# Patient Record
Sex: Female | Born: 1975 | Race: White | Hispanic: No | Marital: Married | Smoking: Never smoker
Health system: Southern US, Community
[De-identification: ages and names within clinical notes are randomized; demographics above are authoritative.]

## PROBLEM LIST (undated history)

## (undated) DIAGNOSIS — E282 Polycystic ovarian syndrome: Secondary | ICD-10-CM

---

## 2020-03-16 ENCOUNTER — Emergency Department: Payer: Self-pay

## 2020-03-16 ENCOUNTER — Other Ambulatory Visit: Payer: Self-pay

## 2020-03-16 ENCOUNTER — Emergency Department
Admission: EM | Admit: 2020-03-16 | Discharge: 2020-03-16 | Disposition: A | Discharge status: Home / Self Care | Payer: Self-pay | Attending: Emergency Medicine | Admitting: Emergency Medicine

## 2020-03-16 DIAGNOSIS — R2990 Unspecified symptoms and signs involving the nervous system: Secondary | ICD-10-CM

## 2020-03-16 DIAGNOSIS — I639 Cerebral infarction, unspecified: Secondary | ICD-10-CM | POA: Insufficient documentation

## 2020-03-16 DIAGNOSIS — H538 Other visual disturbances: Secondary | ICD-10-CM

## 2020-03-16 HISTORY — DX: Polycystic ovarian syndrome: E28.2

## 2020-03-16 LAB — PROTIME-INR
INR: 1 (ref 0.8–1.2)
Prothrombin Time: 12.4 seconds (ref 11.4–15.2)

## 2020-03-16 LAB — CBC
HCT: 41.8 % (ref 36.0–46.0)
Hemoglobin: 14.2 g/dL (ref 12.0–15.0)
MCH: 27.5 pg (ref 26.0–34.0)
MCHC: 34 g/dL (ref 30.0–36.0)
MCV: 80.9 fL (ref 80.0–100.0)
Platelets: 263 10*3/uL (ref 150–400)
RBC: 5.17 MIL/uL — ABNORMAL HIGH (ref 3.87–5.11)
RDW: 13 % (ref 11.5–15.5)
WBC: 9.9 10*3/uL (ref 4.0–10.5)
nRBC: 0 % (ref 0.0–0.2)

## 2020-03-16 LAB — DIFFERENTIAL
Abs Immature Granulocytes: 0.03 10*3/uL (ref 0.00–0.07)
Basophils Absolute: 0 10*3/uL (ref 0.0–0.1)
Basophils Relative: 0 %
Eosinophils Absolute: 0.1 10*3/uL (ref 0.0–0.5)
Eosinophils Relative: 1 %
Immature Granulocytes: 0 %
Lymphocytes Relative: 22 %
Lymphs Abs: 2.2 10*3/uL (ref 0.7–4.0)
Monocytes Absolute: 0.6 10*3/uL (ref 0.1–1.0)
Monocytes Relative: 6 %
Neutro Abs: 7 10*3/uL (ref 1.7–7.7)
Neutrophils Relative %: 71 %

## 2020-03-16 LAB — URINALYSIS, COMPLETE (UACMP) WITH MICROSCOPIC
Bilirubin Urine: NEGATIVE
Glucose, UA: 50 mg/dL — AB
Hgb urine dipstick: NEGATIVE
Ketones, ur: NEGATIVE mg/dL
Nitrite: NEGATIVE
Protein, ur: NEGATIVE mg/dL
Specific Gravity, Urine: 1.011 (ref 1.005–1.030)
pH: 7 (ref 5.0–8.0)

## 2020-03-16 LAB — POC URINE PREG, ED: Preg Test, Ur: NEGATIVE

## 2020-03-16 LAB — COMPREHENSIVE METABOLIC PANEL
ALT: 16 U/L (ref 0–44)
AST: 17 U/L (ref 15–41)
Albumin: 4.2 g/dL (ref 3.5–5.0)
Alkaline Phosphatase: 59 U/L (ref 38–126)
Anion gap: 9 (ref 5–15)
BUN: 15 mg/dL (ref 6–20)
CO2: 27 mmol/L (ref 22–32)
Calcium: 9.3 mg/dL (ref 8.9–10.3)
Chloride: 101 mmol/L (ref 98–111)
Creatinine, Ser: 0.75 mg/dL (ref 0.44–1.00)
GFR, Estimated: >60 mL/min (ref >60)
Glucose, Bld: 111 mg/dL — ABNORMAL HIGH (ref 70–99)
Potassium: 4 mmol/L (ref 3.5–5.1)
Sodium: 137 mmol/L (ref 135–145)
Total Bilirubin: 0.5 mg/dL (ref 0.3–1.2)
Total Protein: 7.9 g/dL (ref 6.5–8.1)

## 2020-03-16 LAB — APTT: aPTT: 29 seconds (ref 24–36)

## 2020-03-16 LAB — CBG MONITORING, ED: Glucose-Capillary: 126 mg/dL — ABNORMAL HIGH (ref 70–99)

## 2020-03-16 LAB — TROPONIN I (HIGH SENSITIVITY): Troponin I (High Sensitivity): 3 ng/L (ref <18)

## 2020-03-16 IMAGING — CT CT HEAD CODE STROKE
3 series · 16 of 46 positions shown, 19 images · non-contrast
Comparison: None.

CLINICAL DATA: Code stroke.  Abnormal vision

EXAM:
CT HEAD WITHOUT CONTRAST
TECHNIQUE: Contiguous axial images were obtained from the base of the skull
through the vertex without intravenous contrast.

[Series 3: head wo · axial · 0.39mm/px · z∈[-148,-28]mm · 10 of 29 slices shown, 13 images]
[im 3/29  brain]
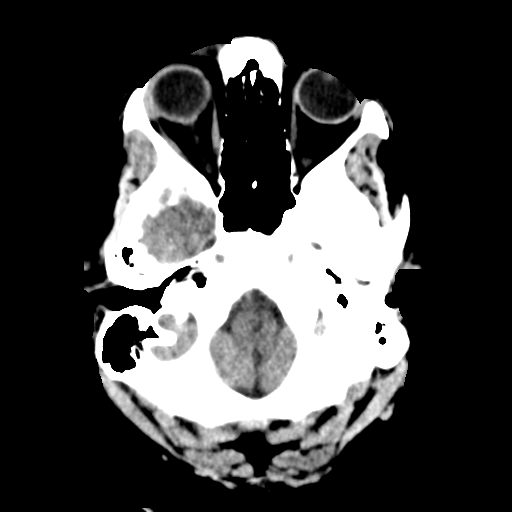
[im 3/29  bone]
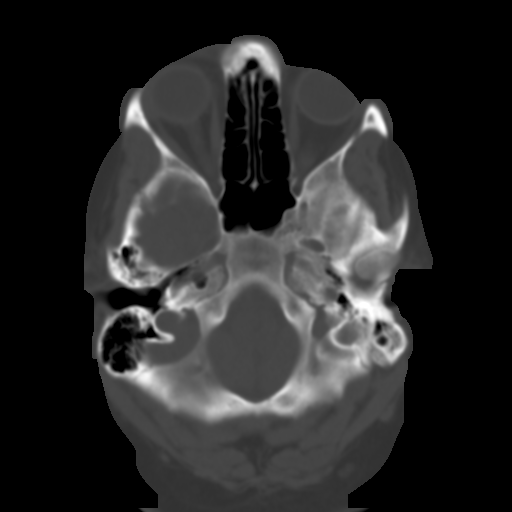
[im 6/29  brain]
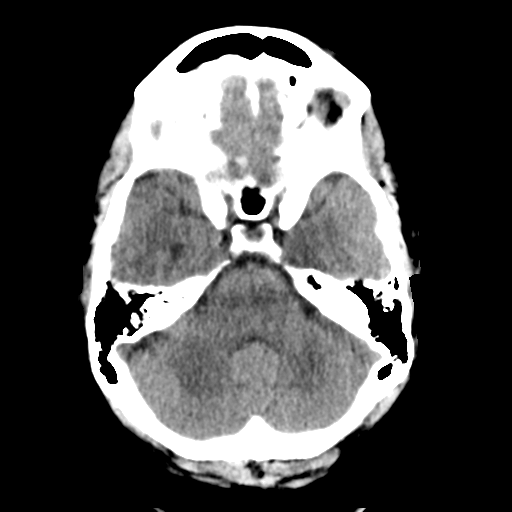
[im 8/29  brain]
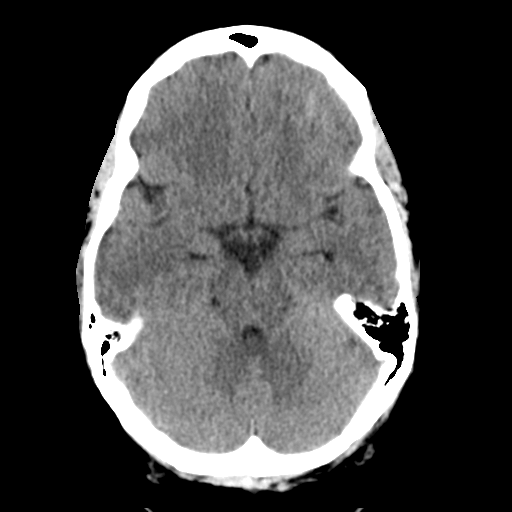
[im 11/29  brain]
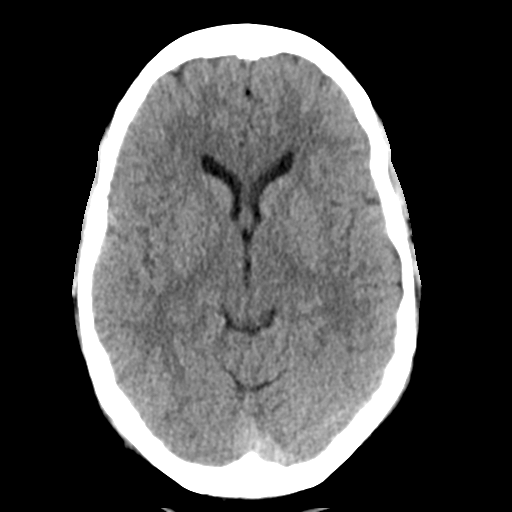
[im 14/29  brain]
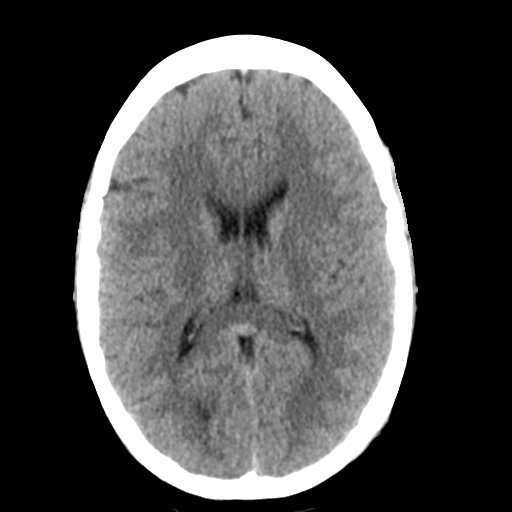
[im 14/29  bone]
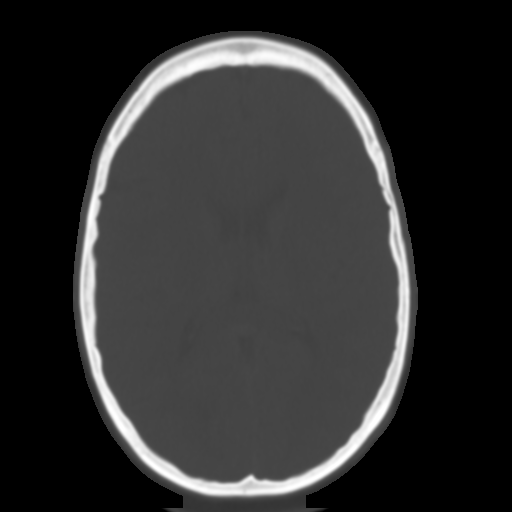
[im 16/29  brain]
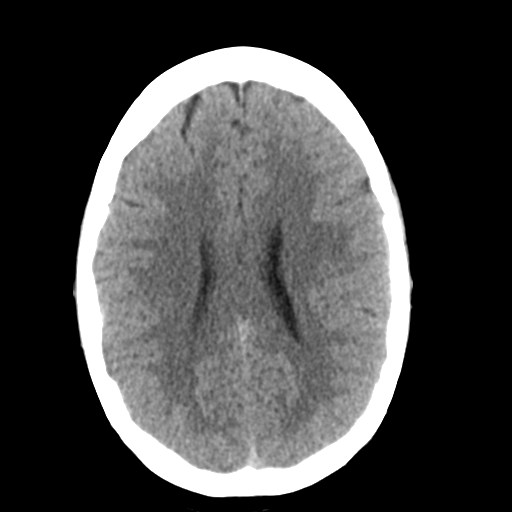
[im 19/29  brain]
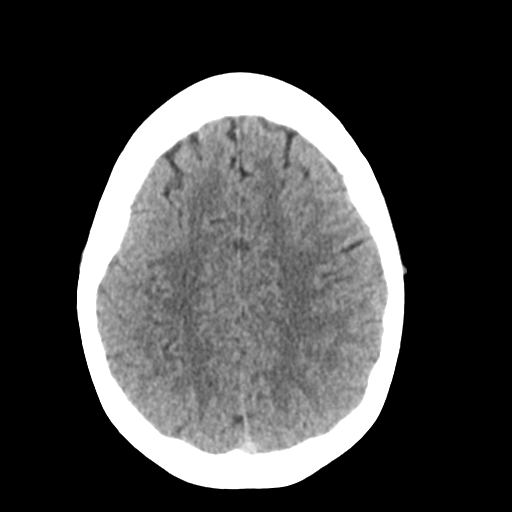
[im 22/29  brain]
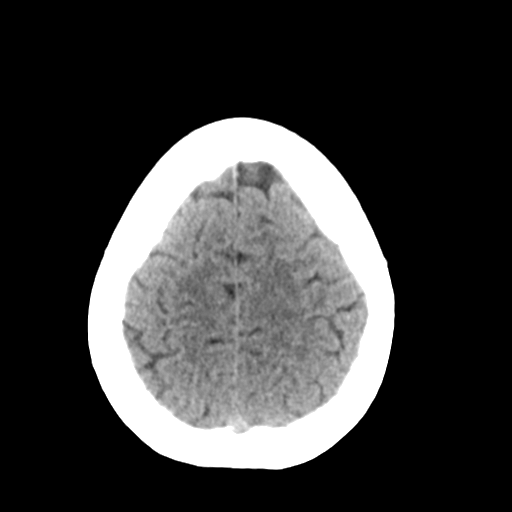
[im 24/29  brain]
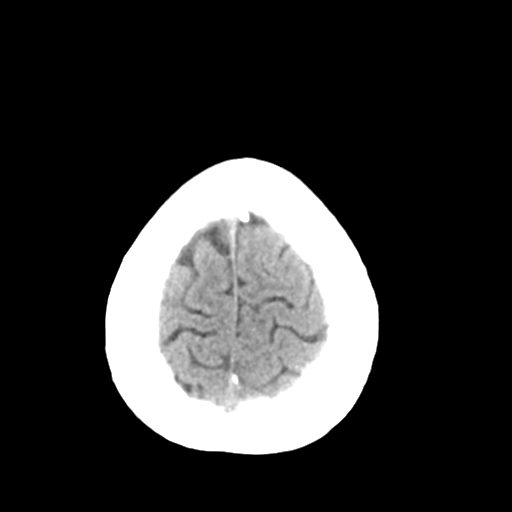
[im 24/29  bone]
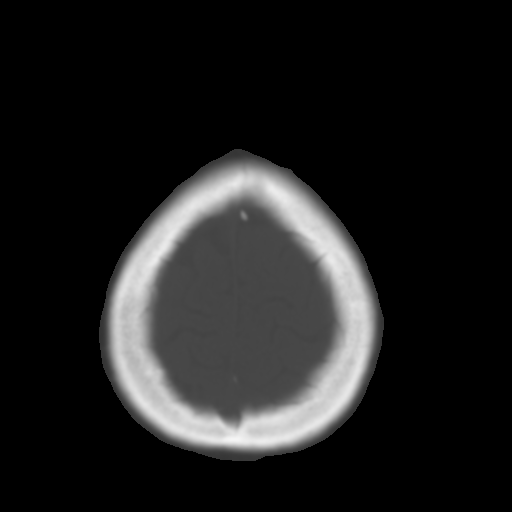
[im 27/29  brain]
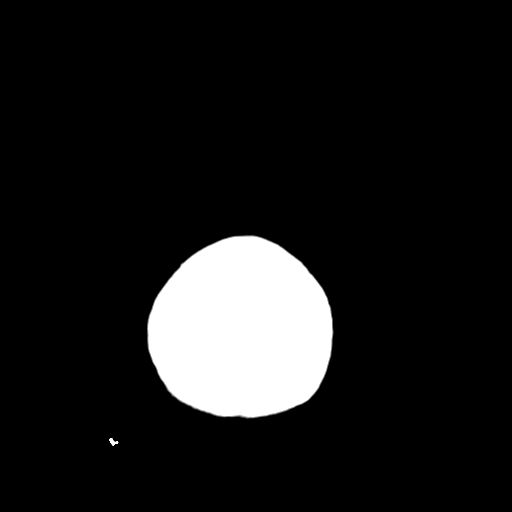

[Series 5: coronal soft tissue · coronal · 0.31mm/px · 3 of 67 slices shown]
[im 23/67  brain]
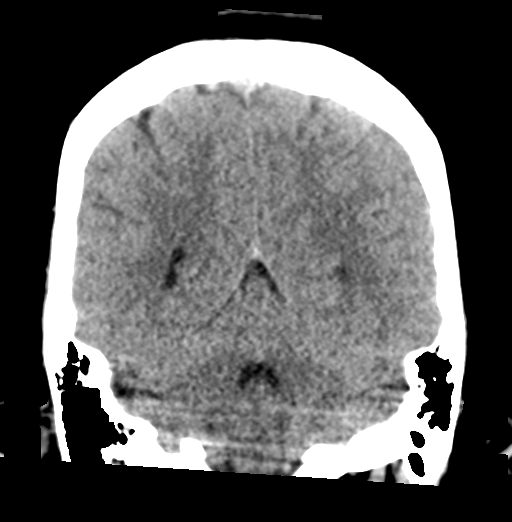
[im 30/67  brain]
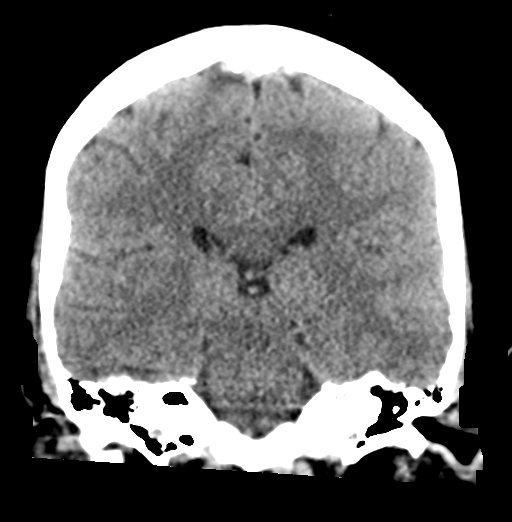
[im 37/67  brain]
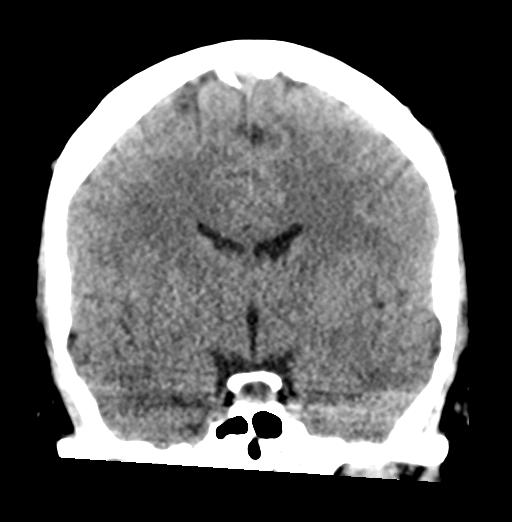

[Series 6: sagittal soft tissue · sagittal · 0.31mm/px · 3 of 59 slices shown]
[im 20/59  brain]
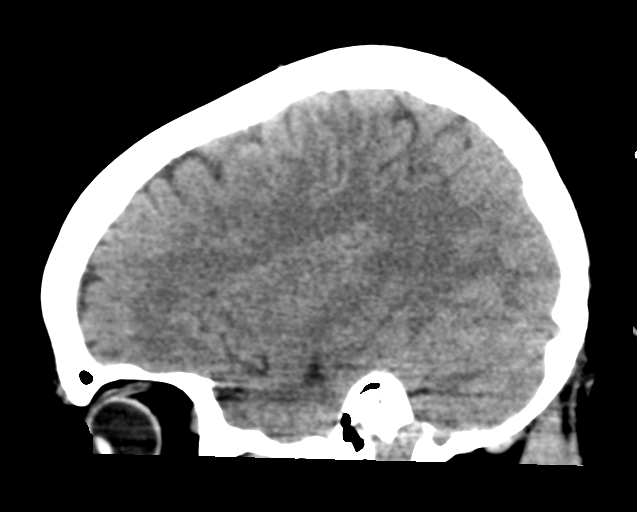
[im 30/59  brain]
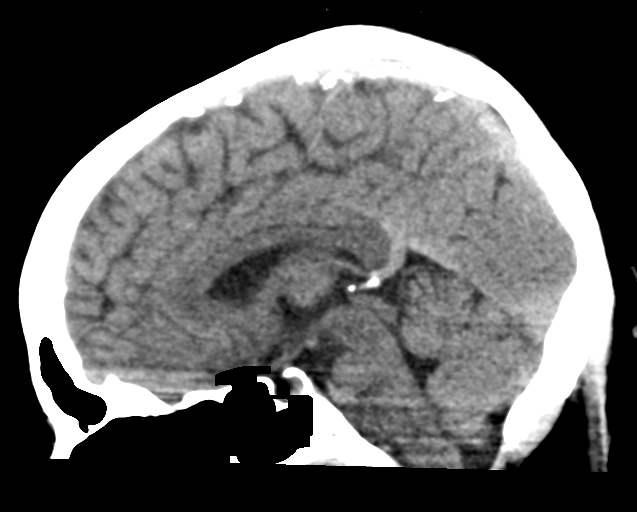
[im 39/59  brain]
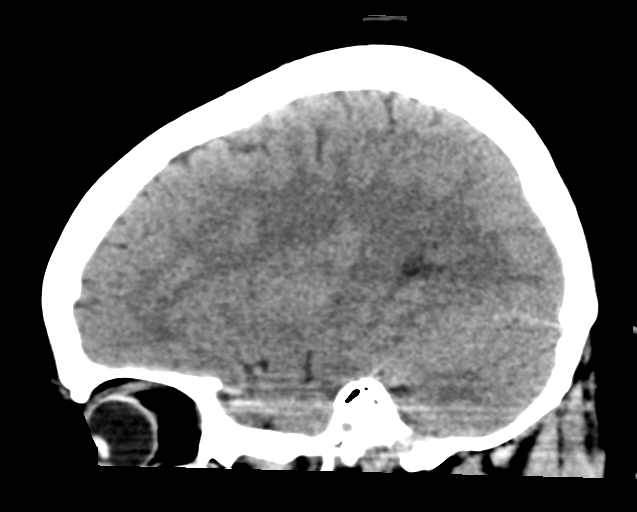

[16 of 46 positions shown; findings below may reference images not displayed]

FINDINGS: Brain: No acute intracranial hemorrhage, mass effect, or edema.
Gray-white differentiation is preserved. Ventricles and sulci are
within normal limits in size and configuration.

Vascular: No hyperdense vessel.

Skull: Unremarkable.

Sinuses/Orbits: Aerated.  Orbits are unremarkable.

Other: Mastoid air cells are clear.

ASPECTS (Alberta Stroke Program Early CT Score)

- Ganglionic level infarction (caudate, lentiform nuclei, internal
capsule, insula, M1-M3 cortex): 7

- Supraganglionic infarction (M4-M6 cortex): 3

Total score (0-10 with 10 being normal): 10
IMPRESSION: There is no acute intracranial hemorrhage or evidence of acute
infarction. ASPECT score is 10.

These results were communicated to Dr. KLPIGBB at [DATE] on
[DATE] by text page via the AMION messaging system.

## 2020-03-16 IMAGING — MR MR HEAD W/O CM
10 of 11 series · 38 of 48 positions shown · non-contrast
Comparison: [DATE] head CT.

CLINICAL DATA: Neuro deficit, acute, stroke suspected

EXAM:
MRI HEAD WITHOUT CONTRAST
TECHNIQUE: Multiplanar, multiecho pulse sequences of the brain and surrounding
structures were obtained without intravenous contrast.

[Series 2: cor dwi_tracew · coronal · 5.0mm · 0.60mm/px · 4 of 40 slices shown]
[im 1/40]
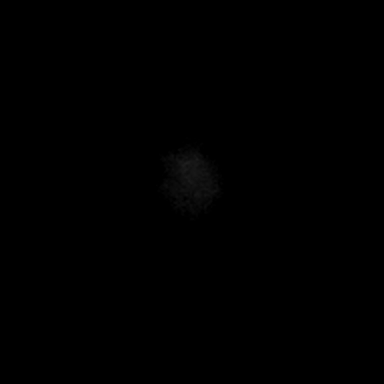
[im 14/40]
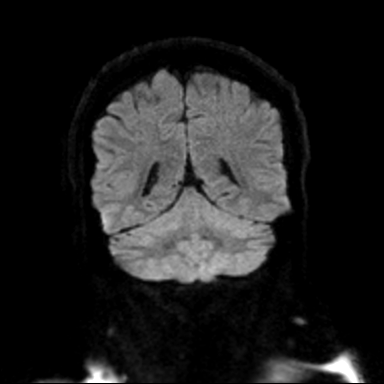
[im 27/40]
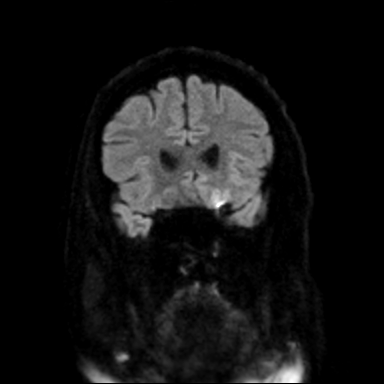
[im 40/40]
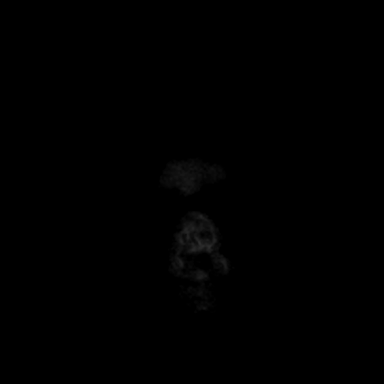

[Series 3: cor dwi_adc · coronal · 5.0mm · 0.60mm/px · 4 of 39 slices shown]
[im 1/39]
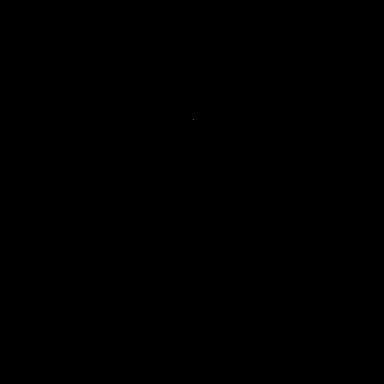
[im 13/39]
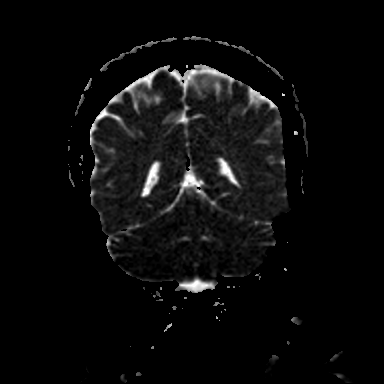
[im 26/39]
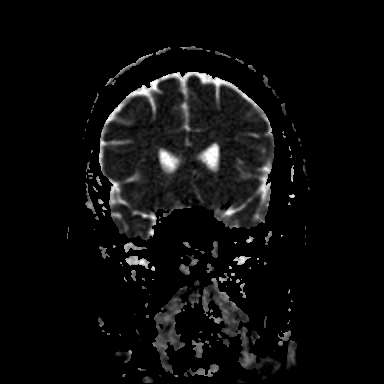
[im 39/39]
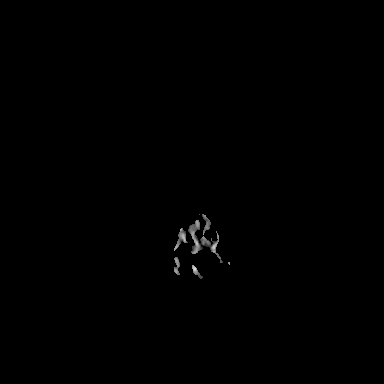

[Series 4: ax dwi_tracew · axial · 3.0mm · 0.60mm/px · z∈[-134,+19]mm · 6 of 48 slices shown]
[im 1/48]
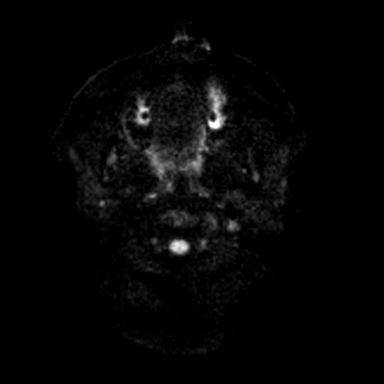
[im 10/48]
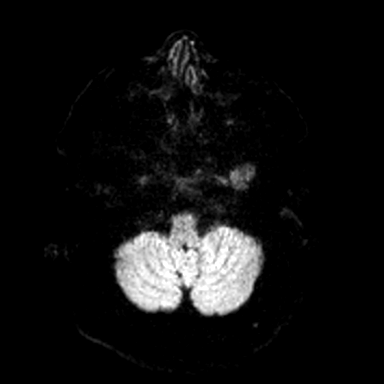
[im 19/48]
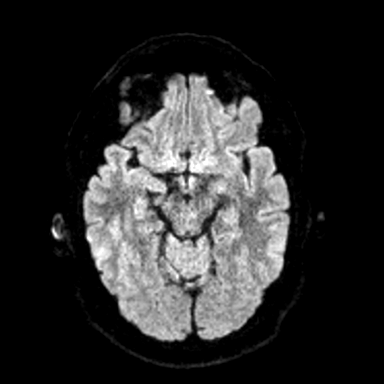
[im 29/48]
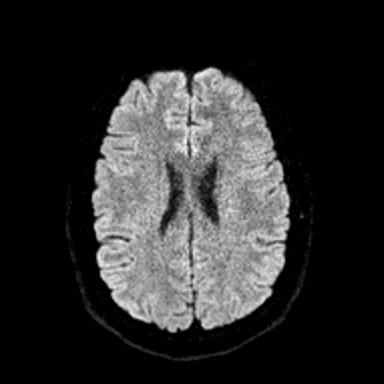
[im 38/48]
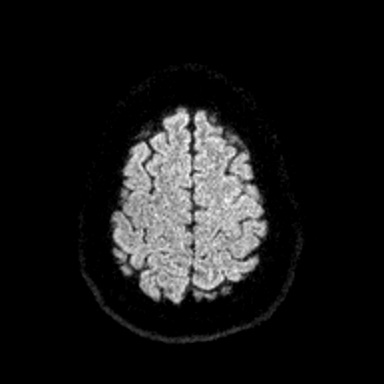
[im 48/48]
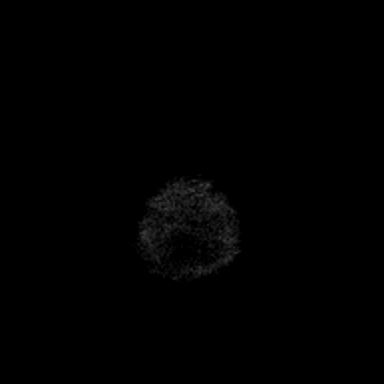

[Series 5: ax dwi_adc · axial · 3.0mm · 0.60mm/px · z∈[-134,+19]mm · 6 of 48 slices shown]
[im 1/48]
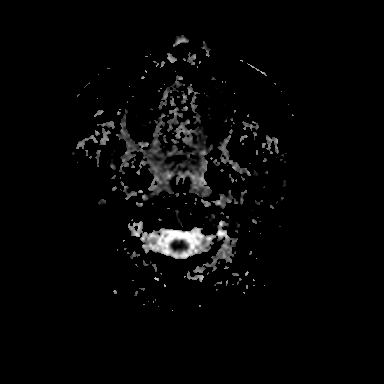
[im 10/48]
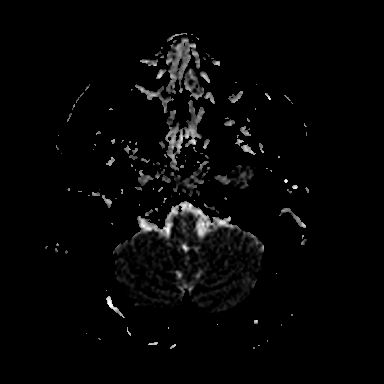
[im 19/48]
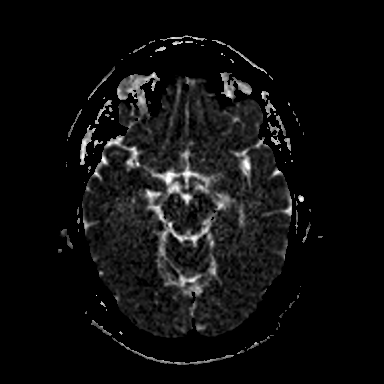
[im 29/48]
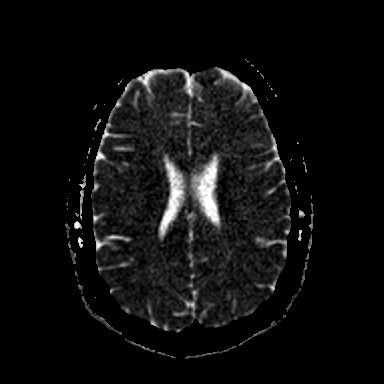
[im 38/48]
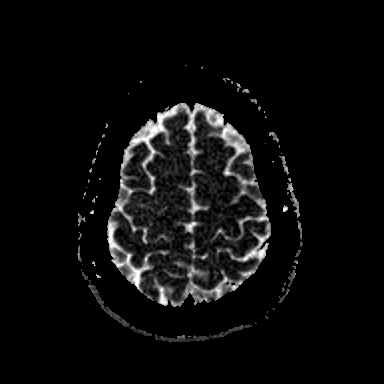
[im 48/48]
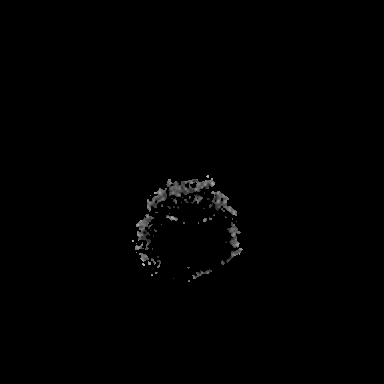

[Series 6: FLAIR · axial · 5.0mm · 1.20mm/px · z∈[-134,+20]mm · 3 of 27 slices shown]
[im 1/27]
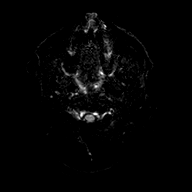
[im 14/27]
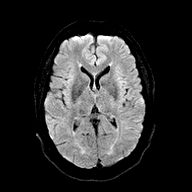
[im 27/27]
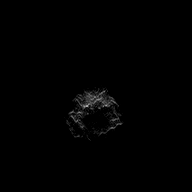

[Series 7: T1 · sagittal · 5.0mm · 0.94mm/px · 3 of 23 slices shown (1 of 2)]
[im 1/23]
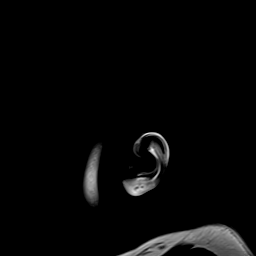
[im 12/23]
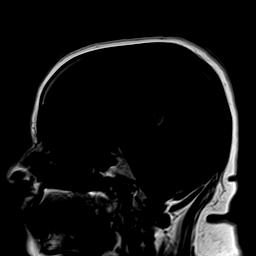
[im 23/23]
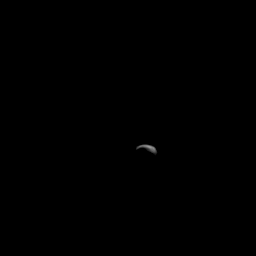

[Series 8: T2 · axial · 5.0mm · 0.45mm/px · z∈[-133,+21]mm · 3 of 27 slices shown (1 of 2)]
[im 1/27]
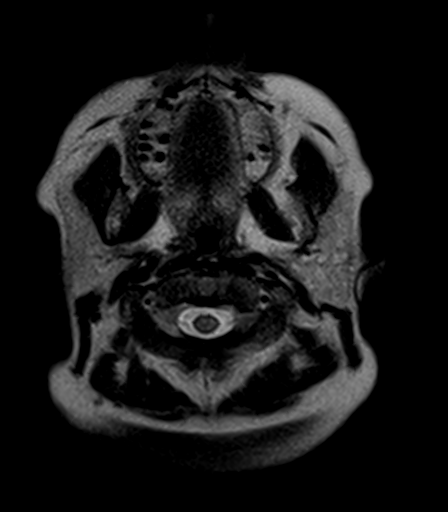
[im 14/27]
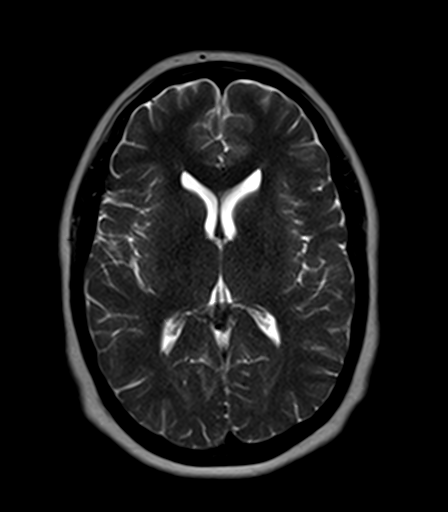
[im 27/27]
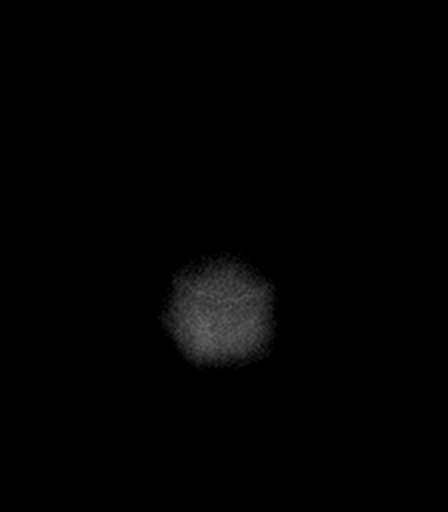

[Series 10: pha_images · axial · 3.0mm · 0.90mm/px · z∈[-126,-99]mm · 2 of 48 slices shown]
[im 1/48]
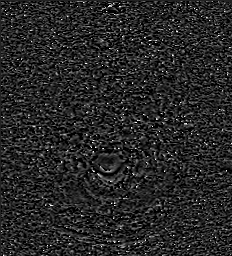
[im 10/48]
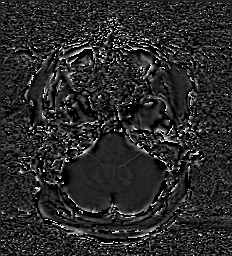

[Series 13: T1 · axial · 5.0mm · 0.90mm/px · z∈[-133,+21]mm · 3 of 27 slices shown (2 of 2)]
[im 1/27]
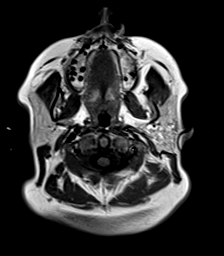
[im 14/27]
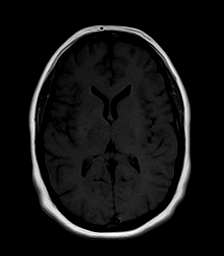
[im 27/27]
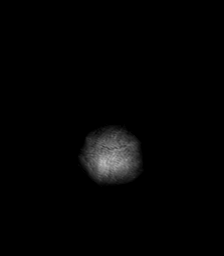

[Series 14: T2 · coronal · 5.0mm · 0.45mm/px · 4 of 31 slices shown (2 of 2)]
[im 1/31]
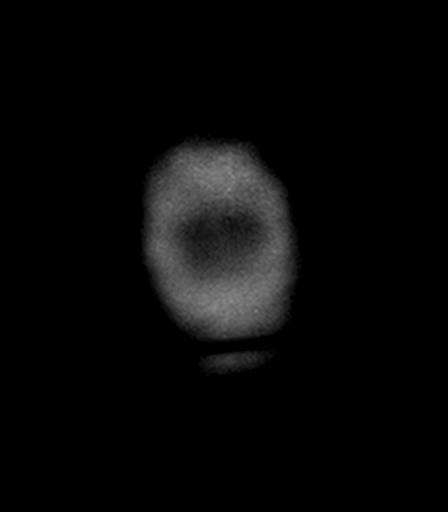
[im 11/31]
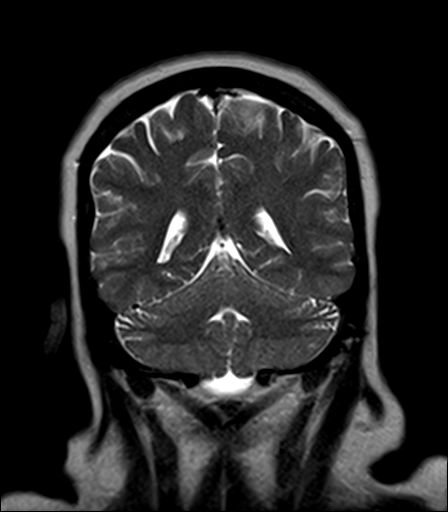
[im 21/31]
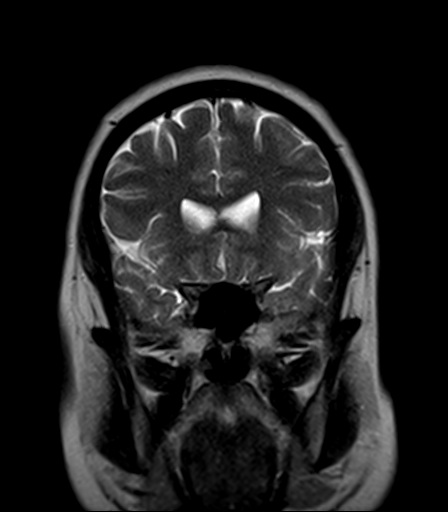
[im 31/31]
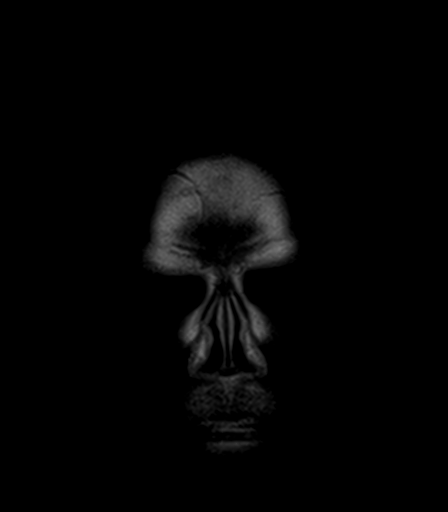

[38 of 48 positions shown; findings below may reference images not displayed]

FINDINGS: Brain: No diffusion-weighted signal abnormality. No intracranial
hemorrhage. No midline shift, ventriculomegaly or extra-axial fluid
collection. No mass lesion.

Vascular: Please see concurrent MRA.

Skull and upper cervical spine: Normal marrow signal.

Sinuses/Orbits: Normal orbits. Clear paranasal sinuses. Trace
mastoid effusion.

Other: None.
IMPRESSION: No acute intracranial process.

## 2020-03-16 IMAGING — MR MR MRA HEAD W/O CM
1 series · 20 of 48 positions shown · non-contrast
Comparison: None.

CLINICAL DATA: Blurry vision

EXAM:
MRA HEAD WITHOUT CONTRAST
TECHNIQUE: Angiographic images of the Circle of Willis were obtained using MRA
technique without intravenous contrast.

[Series 5: TOF · axial · 0.5mm · 0.41mm/px · z∈[-134,-37]mm · 20 of 205 slices shown]
[im 1/205]
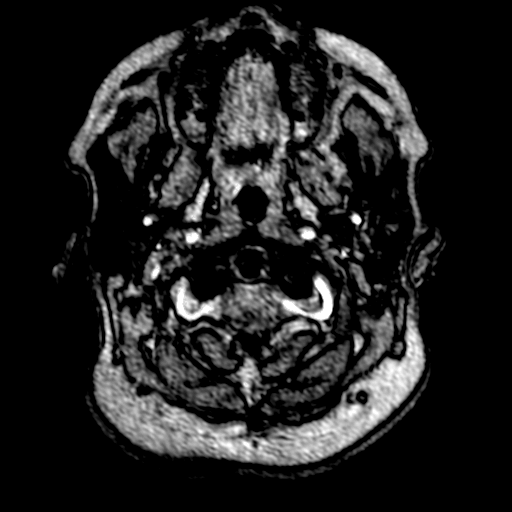
[im 5/205]
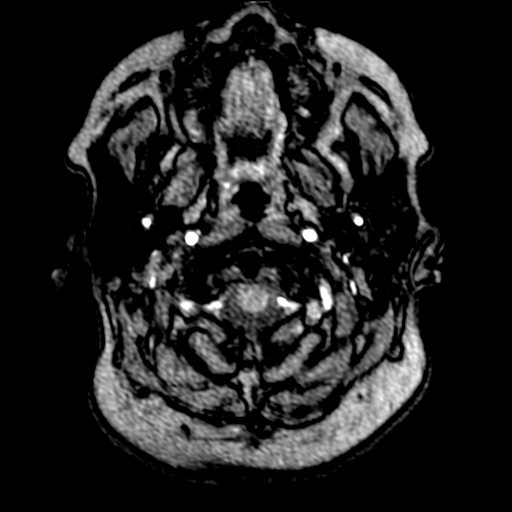
[im 9/205]
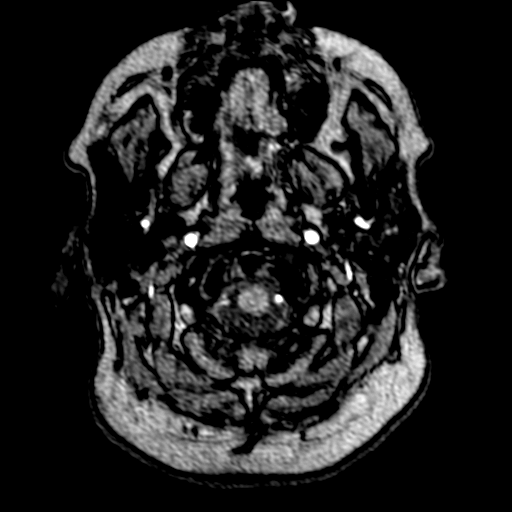
[im 14/205]
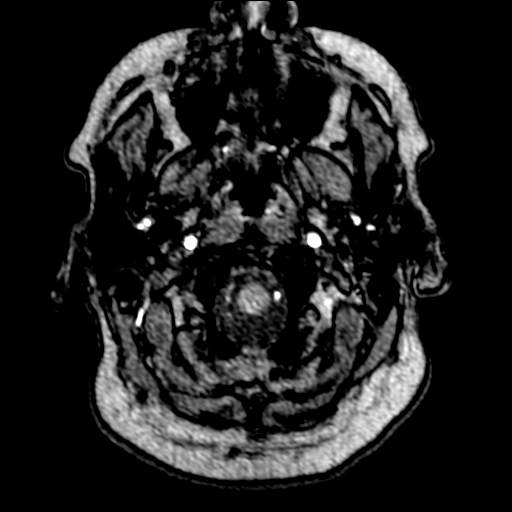
[im 18/205]
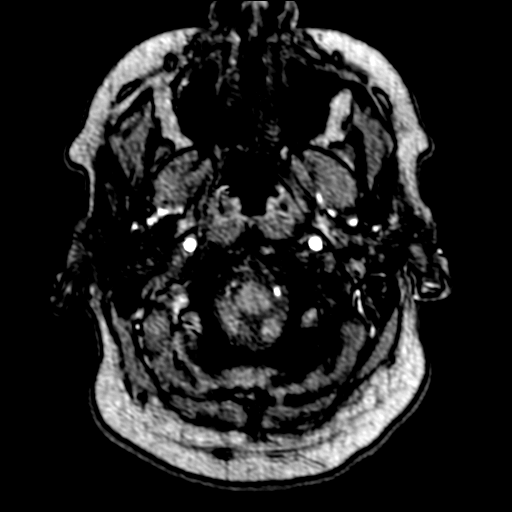
[im 22/205]
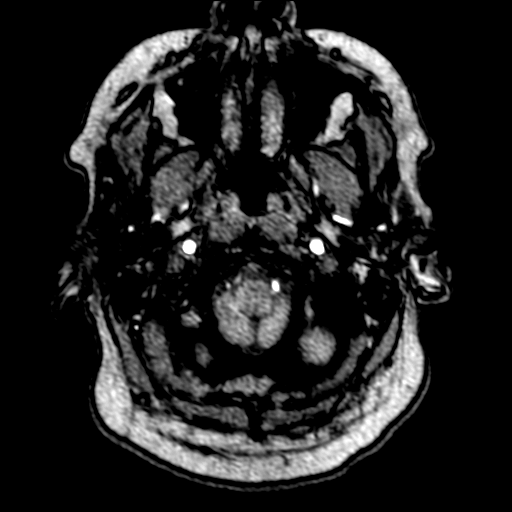
[im 27/205]
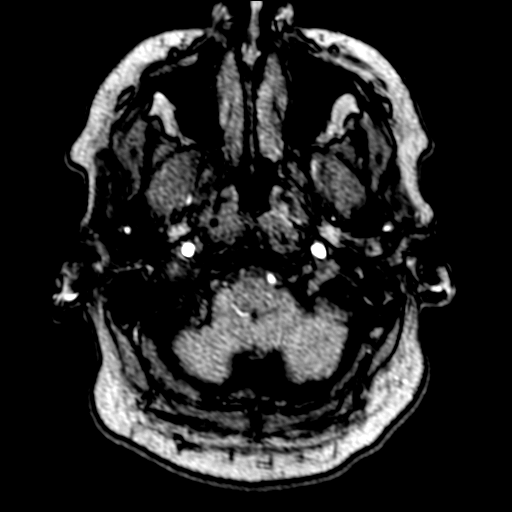
[im 31/205]
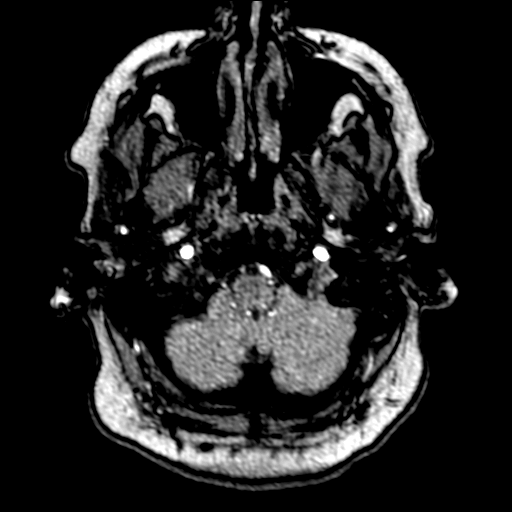
[im 35/205]
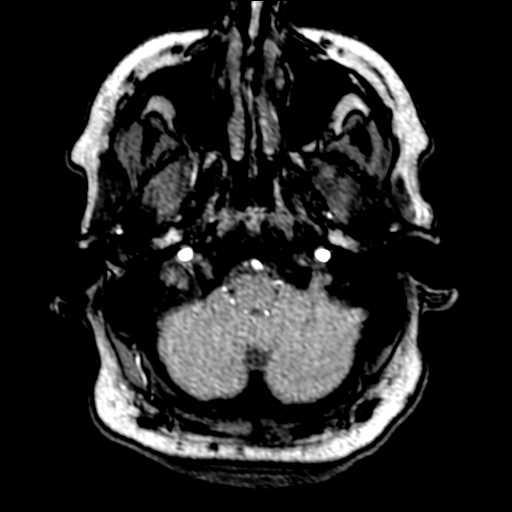
[im 40/205]
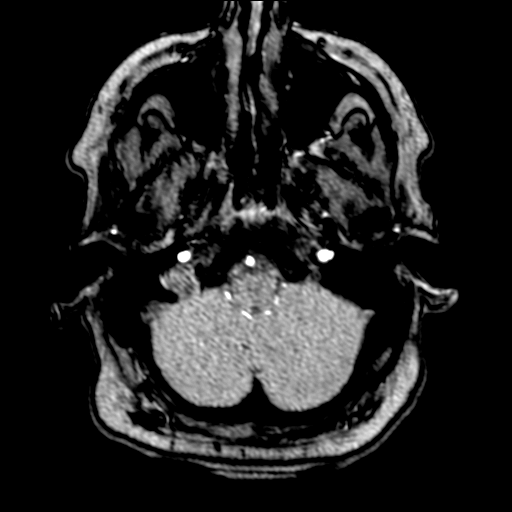
[im 44/205]
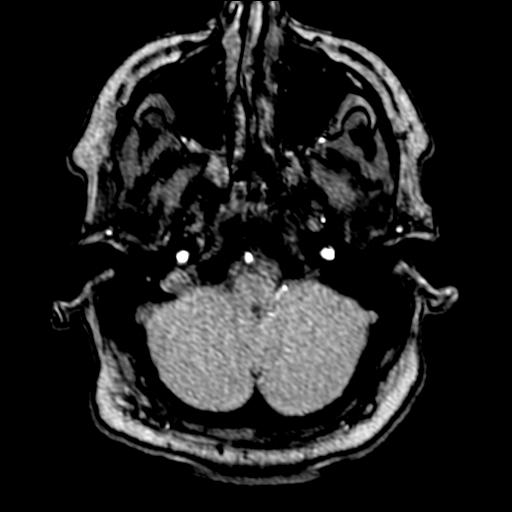
[im 48/205]
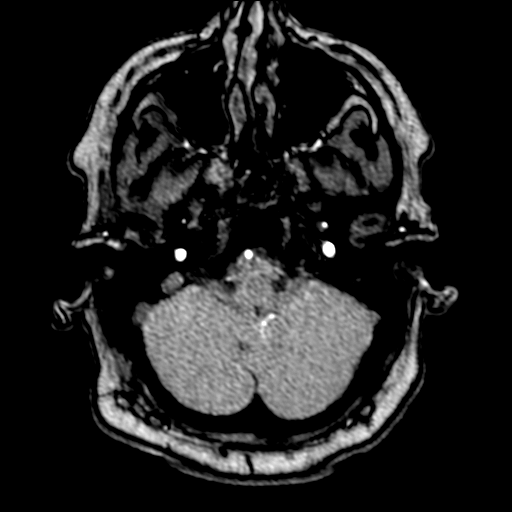
[im 66/205]
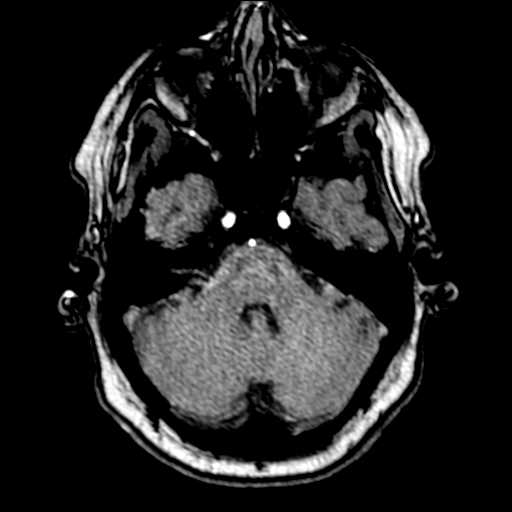
[im 92/205]
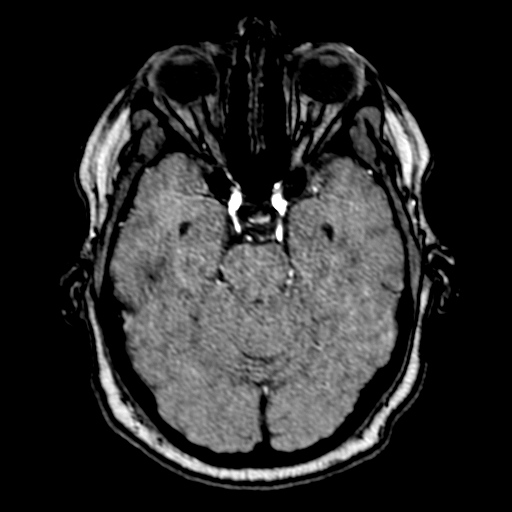
[im 105/205]
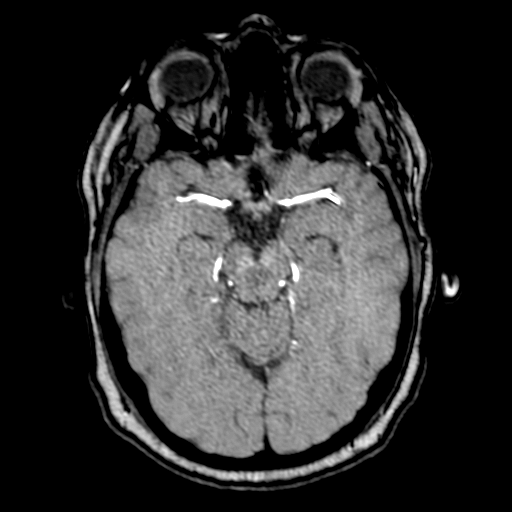
[im 118/205]
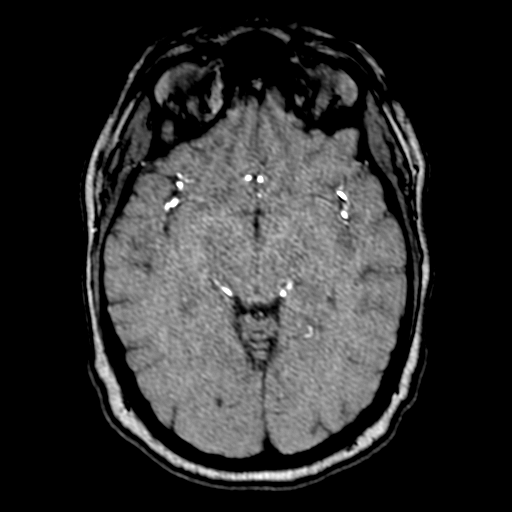
[im 144/205]
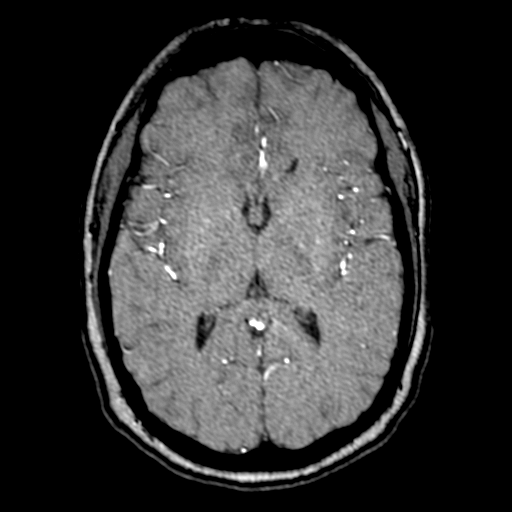
[im 170/205]
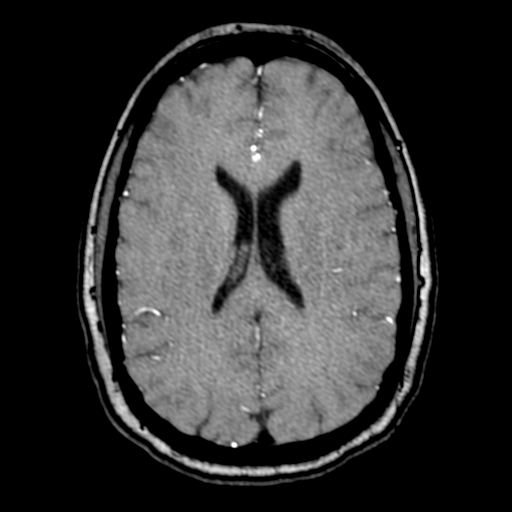
[im 174/205]
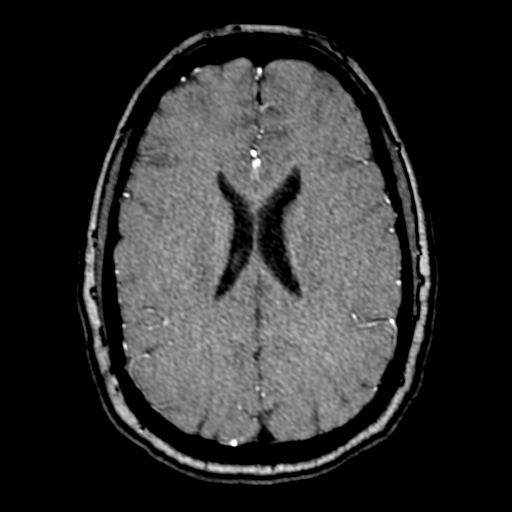
[im 196/205]
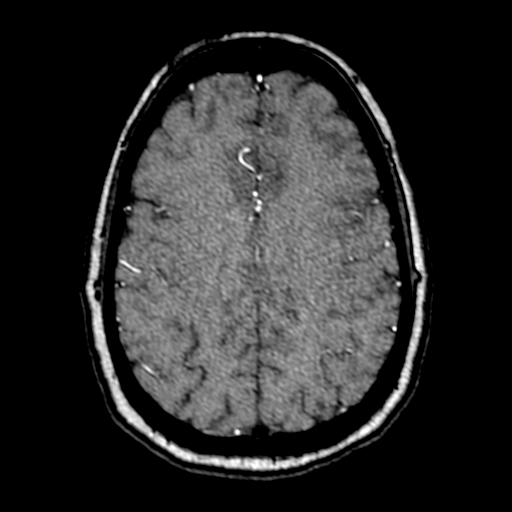

[20 of 48 positions shown; findings below may reference images not displayed]

FINDINGS: Intracranial internal carotid arteries are patent. Middle and
anterior cerebral arteries are patent. Intracranial vertebral
arteries, basilar artery, posterior cerebral arteries are patent.
Left vertebral artery is dominant. Right vertebral artery appears
small in caliber after PICA origin. Bilateral posterior
communicating arteries are present and are the primary supply of the
posterior cerebral arteries.
IMPRESSION: No proximal intracranial vessel occlusion.

## 2020-03-16 IMAGING — DX DG CHEST 1V PORT
1 series · 1 of 1 positions shown · non-contrast
Comparison: None.

CLINICAL DATA: Chest pain.

EXAM:
PORTABLE CHEST 1 VIEW

[chest ap]
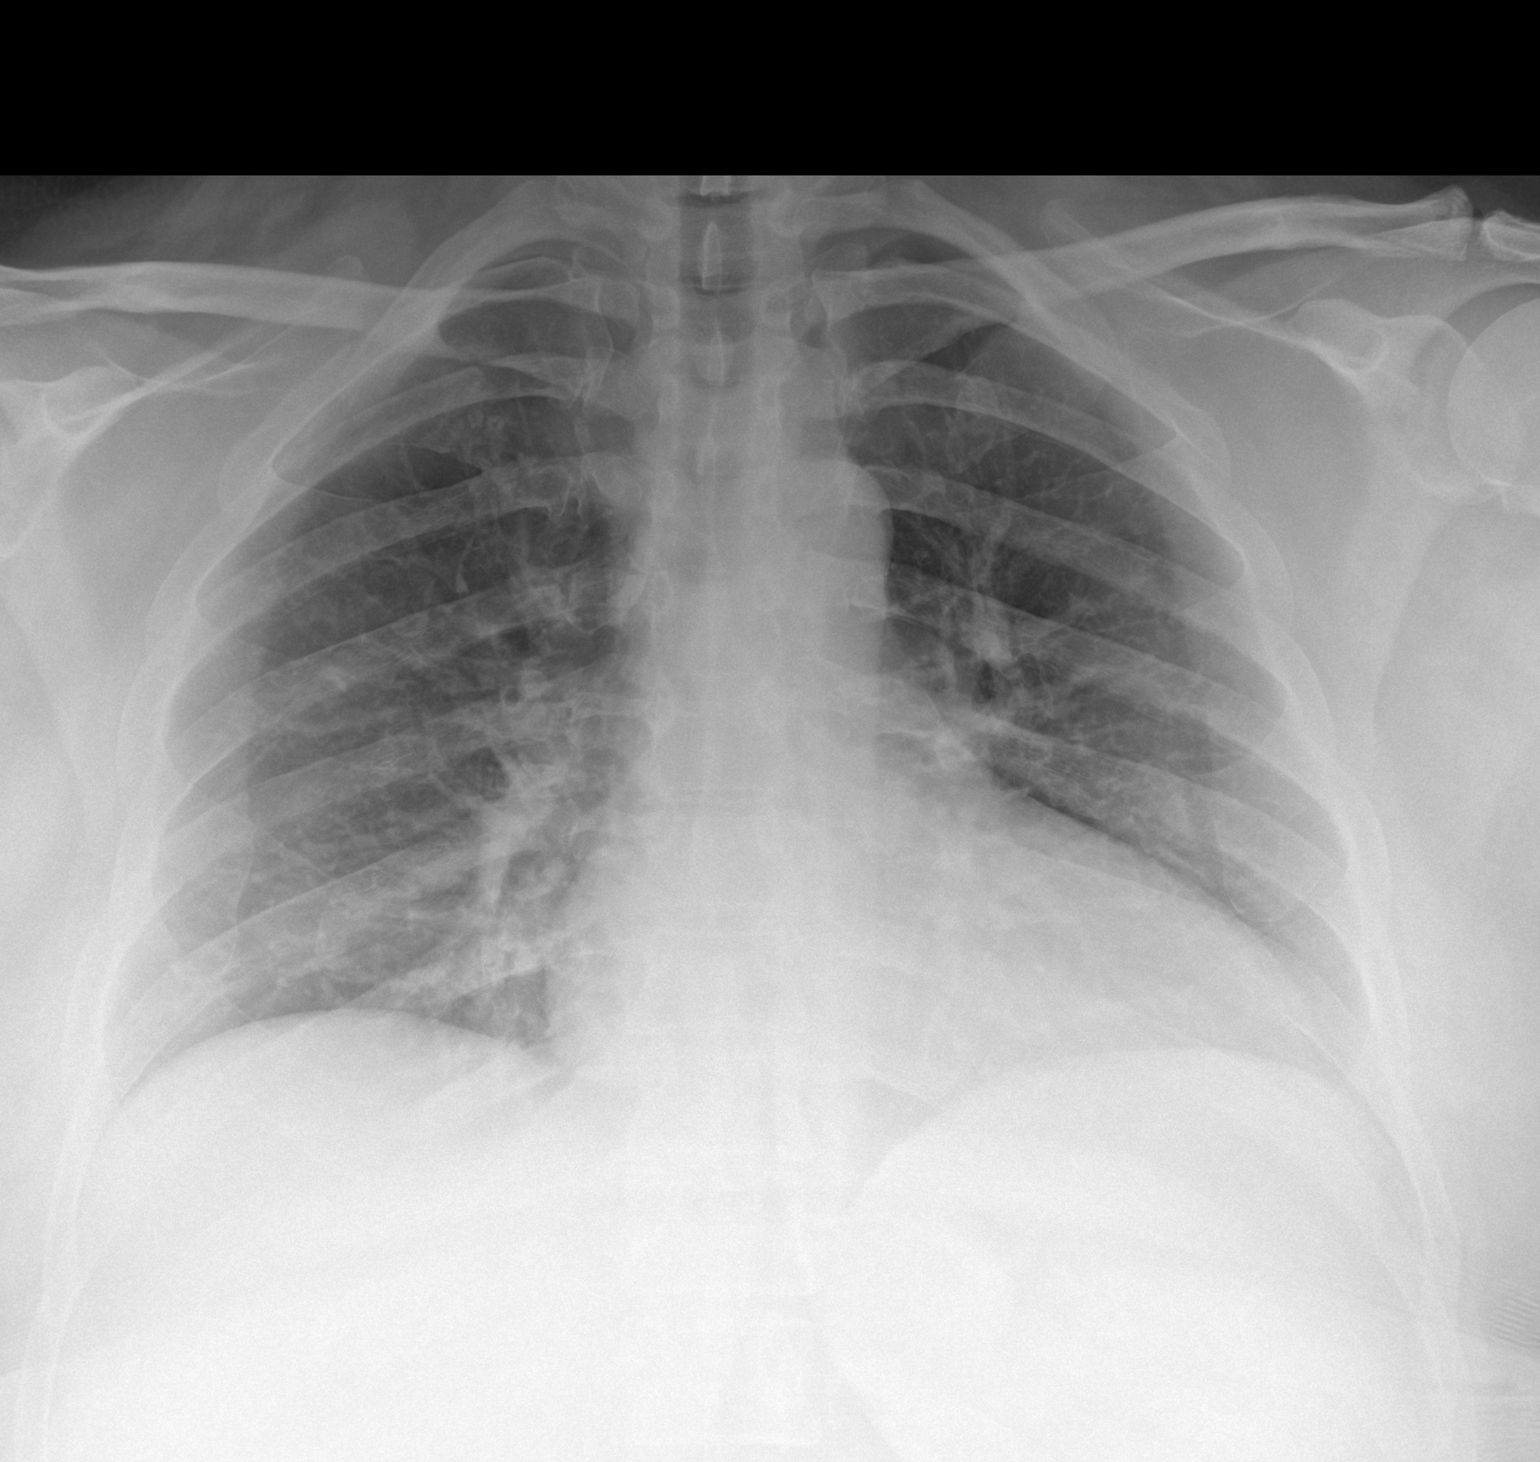

[1 of 1 positions shown; findings below may reference images not displayed]

FINDINGS: The heart size and mediastinal contours are within normal limits.
Both lungs are clear. No pneumothorax or pleural effusion is noted.
The visualized skeletal structures are unremarkable.
IMPRESSION: No active disease.

## 2020-03-16 MED ORDER — SODIUM CHLORIDE 0.9% FLUSH
3.0000 mL | Freq: Once | INTRAVENOUS | Status: DC
Start: 2020-03-16 — End: 2020-03-16

## 2020-03-16 NOTE — Discharge Instructions (Addendum)
Your work-up in the emergency department has been reassuring.  Please follow-up with ophthalmology by calling the number provided to arrange a follow-up appointment soon as possible.  Return to the emergency department for any weakness or numbness of any arm or leg confusion slurred speech or worsening vision.

## 2020-03-16 NOTE — ED Triage Notes (Signed)
Awoke this morning with blurred vision.  Woke up at 0430, did not notice blurred vision until she was driving at 3358.   Noticed that things near were clear.  Looking further out was blurred.  Also c.o left sided chest pain.  AAOx3.  Skin warm and dry. MAE equally and strong.  Speech clear.

## 2020-03-16 NOTE — Consult Note (Signed)
Neurology Consultation  Reason for Consult: Code stroke for vision disturbance Referring Physician: Dr. Kerman Passey, ED provider  CC: Blurred vision  History is obtained from: Patient, Chart and care everywhere  HPI: Rachel Conway is a 44 y.o. female past medical history of polycystic ovarian syndrome, diabetes, recurrent UTIs, menorrhagia, presenting to the emergency room for evaluation of sudden onset of visual disturbances.  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  She was not able to see the markings on the road correctly.  The markings on the road at the distance seemed as they were finding out.  She had a mild headache at that time that has since resolved.  She brought herself to the hospital and was evaluated at triage.  Due to the concern for visual symptoms and concern for posterior circulation stroke, code stroke was activated. She appeared extremely anxious.  Told me that she is from [place], currently driving through [place].  She was coming from [place]. Reports left-sided numbness that also started this morning. No history of migraines. Did have a mild headache this morning that resolved No prior history of strokes or heart attacks.   LKW: 4:30 AM tpa given?: no, outside the window Premorbid modified Rankin scale (mRS):0  ROS: Performed and negative except as noted in HPI.  Past Medical History:  Diagnosis Date  . Polycystic ovary syndrome    Past medical history Polycystic ovarian syndrome, diabetes  No family history on file.   Social History:   reports that she has never smoked. She has never used smokeless tobacco. She reports that she does not drink alcohol and does not use drugs. Reports no tobacco, alcohol or illicit drug use.  Medications  Current Facility-Administered Medications:  .  sodium chloride flush (NS) 0.9 %  injection 3 mL, 3 mL, Intravenous, Once, [place], MD No current outpatient medications on file.  Reports allergies to Levaquin and contrast dye  Exam: Current vital signs: BP 119/69 (BP Location: Left Arm)   Pulse 71   Temp 98.3 F (36.8 C) (Oral)   Resp 16   Ht $R'5\' 3"'mP$  (1.6 m)   Wt 90.7 kg   LMP 03/08/2020   SpO2 99%   BMI 35.43 kg/m  Vital signs in last 24 hours: Temp:  [98.3 F (36.8 C)] 98.3 F (36.8 C) (12/15 1047) Pulse Rate:  [71] 71 (12/15 1047) Resp:  [16] 16 (12/15 1047) BP: (119)/(69) 119/69 (12/15 1047) SpO2:  [99 %] 99 % (12/15 1047) Weight:  [90.7 kg] 90.7 kg (12/15 1046)  General: Awake alert, somewhat anxious appearing HEENT: Normocephalic atraumatic Lungs: Clear Prevascular: Regular rhythm Abdomen soft nondistended nontender Extremities warm well perfused Neurological exam Awake alert oriented x3 Speech is nondysarthric No evidence of aphasia Cranial nerves: Pupils equal round react light, extraocular movements intact, reports seeing fanned out objects-when following my finger, reported that she sees my fingertip fanned out more so when I take it farther away.  No nystagmus.  Visual fields intact.  Facial sensation diminished on the left side to light touch and to vibration on the forehead, reports feeling the vibratory sense like an electric shock on the left forehead versus normal vibration on the right.  Tongue and palate midline. Motor exam: No drift in any of the 4 extremities.  5/5 strength. Sensory exam: Reports left hemibody hypoesthesia to light touch.  Coordination: No dysmetria Gait normal NIH stroke scale 1a Level of Conscious.: 0 1b LOC Questions: 0 1c LOC Commands: 0 2 Best Gaze: 0 3 Visual: 0 4 Facial Palsy: 0 5a Motor Arm - left: 0 5b Motor Arm - Right: 0 6a Motor Leg - Left: 0 6b Motor Leg - Right: 0 7 Limb Ataxia: 0 8 Sensory: 1 9 Best Language: 0 10 Dysarthria: 0 11 Extinct. and Inatten.: 0 TOTAL: 1  Labs I have  reviewed labs in epic and the results pertinent to this consultation are:   CBC    Component Value Date/Time   WBC 9.9 03/16/2020 1051   RBC 5.17 (H) 03/16/2020 1051   HGB 14.2 03/16/2020 1051   HCT 41.8 03/16/2020 1051   PLT 263 03/16/2020 1051   MCV 80.9 03/16/2020 1051   MCH 27.5 03/16/2020 1051   MCHC 34.0 03/16/2020 1051   RDW 13.0 03/16/2020 1051   LYMPHSABS 2.2 03/16/2020 1051   MONOABS 0.6 03/16/2020 1051   EOSABS 0.1 03/16/2020 1051   BASOSABS 0.0 03/16/2020 1051    CMP     Component Value Date/Time   NA 137 03/16/2020 1051   K 4.0 03/16/2020 1051   CL 101 03/16/2020 1051   CO2 27 03/16/2020 1051   GLUCOSE 111 (H) 03/16/2020 1051   BUN 15 03/16/2020 1051   CREATININE 0.75 03/16/2020 1051   CALCIUM 9.3 03/16/2020 1051   PROT 7.9 03/16/2020 1051   ALBUMIN 4.2 03/16/2020 1051   AST 17 03/16/2020 1051   ALT 16 03/16/2020 1051   ALKPHOS 59 03/16/2020 1051   BILITOT 0.5 03/16/2020 1051   GFRNONAA >60 03/16/2020 1051    Imaging I have reviewed the images obtained:  CT-scan of the brain-no acute changes   Assessment: 44 year old woman with above past medical history presenting for sudden onset of visual disturbances.  Her exam is somewhat difficult to localize and has some nonorganic findings associated.  Has a history of recurrent UTIs, which can also cause myriad of nonspecific neurological complaints.  Other than that, other differentials complex migraine versus conversion disorder but given her history of obesity, diabetes, young age-I would do a stat MRI and rule out any acute process. Low on differential - demyelination  Impression: Evaluate for strokelike symptoms Complex migraine versus conversion Evaluate for underlying infection  Recommendations: Stat MRI UA, chest x-ray  -- Amie Portland, MD [facility] Pager: [phone] If 7pm to 7am, please call on call as listed on AMION.   Addendum MRI brain and MRA head reviewed  personally.  No acute changes.  No stroke.  No vascular stenosis or occlusion in the head. No stroke treatment or intervention or work-up required at this time. UA and CXR to r/o any infection. Utox  No further neurological recommendations at this time D/w Dr. Kerman Passey in the ER  -- Amie Portland, MD [facility] Pager: [phone] If 7pm to 7am, please call on call as listed on AMION.

## 2020-03-16 NOTE — ED Notes (Signed)
Code stroke cart activated.  

## 2020-03-16 NOTE — Progress Notes (Signed)
CODE STROKE- PHARMACY COMMUNICATION  Time CODE STROKE called/page received:0940  Time response to CODE STROKE was made (in person):0945   Time Stroke Kit retrieved from [facility] (only if needed): n/a, pt outside window w/ LKW 0430.  Name of Provider/Nurse contacted:Dr. Rory Percy at [facility] bedside for eval.  Past Medical History:  Diagnosis Date  . Polycystic ovary syndrome    Prior to Admission medications   Not on File    Lorna Dibble ,PharmD Clinical Pharmacist  03/16/2020  10:55 AM

## 2020-03-16 NOTE — ED Provider Notes (Signed)
[facility] Emergency Department Provider Note  Time seen: 11:40 AM  I have reviewed the triage vital signs and the nursing notes.   HISTORY  Chief Complaint Visual Field Change   HPI Rodneisha Bonnet is a 44 y.o. female with a past medical history of polycystic ovary syndrome presents to the emergency department with concerns of blurred vision.   According to the patient she woke this morning around 4:30 AM with concerns of blurred vision.  Patient states it felt like there was "junk" in her eyes, states she washed them out and she felt better and the blurred vision resolved.  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  Patient was concerned so she came to the hospital for evaluation.  Patient denies any personal history of stroke.  Denies any weakness or numbness of any arm or leg, confusion or slurred speech.  Given the onset of symptoms patient was made a code stroke upon arrival.  Past Medical History:  Diagnosis Date  . Polycystic ovary syndrome     There are no problems to display for this patient.    Prior to Admission medications   Not on File    Allergies  Allergen Reactions  . Shellfish Allergy     No family history on file.  Social History Social History   Tobacco Use  . Smoking status: Never Smoker  . Smokeless tobacco: Never Used  Substance Use Topics  . Alcohol use: Never  . Drug use: Never    Review of Systems Constitutional: Negative for fever. Eyes: Blurred vision bilaterally. Cardiovascular: Negative for chest pain. Respiratory: Negative for shortness of breath. Gastrointestinal: Negative for abdominal pain, vomiting Musculoskeletal: Negative for musculoskeletal complaints Skin: Negative for skin complaints  Neurological: Negative for headache All other ROS negative  ____________________________________________   PHYSICAL  EXAM:  VITAL SIGNS: ED Triage Vitals  Enc Vitals Group     BP 03/16/20 1047 119/69     Pulse Rate 03/16/20 1047 71     Resp 03/16/20 1047 16     Temp 03/16/20 1047 98.3 F (36.8 C)     Temp Source 03/16/20 1047 Oral     SpO2 03/16/20 1047 99 %     Weight 03/16/20 1046 200 lb (90.7 kg)     Height 03/16/20 1046 5\' 3"  (1.6 m)     Head Circumference --      Peak Flow --      Pain Score 03/16/20 1045 0     Pain Loc --      Pain Edu? --      Excl. in GC? --    Constitutional: Alert and oriented. Well appearing and in no distress. Eyes: Normal exam ENT      Head: Normocephalic and atraumatic.      Mouth/Throat: Mucous membranes are moist. Cardiovascular: Normal rate, regular rhythm. Respiratory: Normal respiratory effort without tachypnea nor retractions. Breath sounds are clear Gastrointestinal: Soft and nontender. No distention. Musculoskeletal: Nontender with normal range of motion in all extremities.  Neurologic:  Normal speech and language.  Patient does have slightly diminished grip strength on the left.  States difficulty raising the left leg but states this is chronic for her. Skin:  Skin is warm, dry and intact.  Psychiatric: Mood and affect are normal.   ____________________________________________    EKG  EKG viewed and interpreted by myself shows a normal sinus rhythm at  65 bpm with a narrow QRS, normal axis, normal intervals, no concerning ST changes.  ____________________________________________    RADIOLOGY  CT scan head is negative MRI of the brain is negative MRA of the brain is negative  ____________________________________________   INITIAL IMPRESSION / ASSESSMENT AND PLAN / ED COURSE  Pertinent labs & imaging results that were available during my care of the patient were reviewed by me and considered in my medical decision making (see chart for details).   Patient presents to the emergency department with complaints of blurred vision.  States it  feels like "allergies" or feels like there is "junk" in her eyes and after she rinsed her eyes out this morning she felt better but states her symptoms returned later so she came to the emergency department for evaluation.  Patient presented as a code stroke, neurology is seen the patient, obtain CT scans and MRI per neurology recommendations.  Neurology does not believe patient is suffering stroke/TIA.  On my exam the patient appears very well, no significant findings besides diminished left grip strength and is subjective blurred vision.  However given her reassuring MRI/MRA did not believe the patient is suffering acute CVA.  We will check labs, urine and a chest x-ray as a precaution.  If remainder of the work-up is negative we will likely discharge home with ophthalmology follow-up.  Patient's work-up is reassuring.  Urinalysis is normal.  Chest x-ray is clear.  Remainder the lab work is reassuring as well.  Given the patient's reassuring work-up we will discharge patient home with ophthalmology follow-up.  Patient agreeable to plan of care.  Kirstein Baxley was evaluated in Emergency Department on 03/16/2020 for the symptoms described in the history of present illness. She was evaluated in the context of the global COVID-19 pandemic, which necessitated consideration that the patient might be at risk for infection with the SARS-CoV-2 virus that causes COVID-19. Institutional protocols and algorithms that pertain to the evaluation of patients at risk for COVID-19 are in a state of rapid change based on information released by regulatory bodies including the CDC and federal and state organizations. These policies and algorithms were followed during the patient's care in the ED.  ____________________________________________   FINAL CLINICAL IMPRESSION(S) / ED DIAGNOSES  Blurred vision   Minna Antis, MD 03/16/20 1232

## 2020-03-16 NOTE — Progress Notes (Signed)
°  Chaplain On-Call responded to Code Stroke notification from the Triage Hallway.  Learned that the patient is assigned to ED room 13, and is receiving CT scan at present.  Chaplain will be available for support as needed.  Chaplain Jeancarlos Marchena B. Matie Dimaano M.Div., [facility]

## 2020-03-16 NOTE — ED Notes (Signed)
Pt states that she is still seeing things as "fanned out" but is able to see with no issue.
